# Patient Record
Sex: Female | Born: 1940 | Race: Black or African American | Hispanic: No | Marital: Single | Smoking: Never smoker
Health system: Southern US, Community
[De-identification: ages and names within clinical notes are randomized; demographics above are authoritative.]

## PROBLEM LIST (undated history)

## (undated) DIAGNOSIS — I10 Essential (primary) hypertension: Secondary | ICD-10-CM

---

## 1998-01-04 ENCOUNTER — Ambulatory Visit (HOSPITAL_COMMUNITY): Admission: RE | Admit: 1998-01-04 | Discharge: 1998-01-04 | Payer: Self-pay | Admitting: Gastroenterology

## 1998-11-14 ENCOUNTER — Ambulatory Visit (HOSPITAL_COMMUNITY): Admission: RE | Admit: 1998-11-14 | Discharge: 1998-11-14 | Payer: Self-pay | Admitting: Gastroenterology

## 2010-06-15 ENCOUNTER — Emergency Department (HOSPITAL_COMMUNITY)
Admission: EM | Admit: 2010-06-15 | Discharge: 2010-06-15 | Payer: Self-pay | Source: Home / Self Care | Admitting: Emergency Medicine

## 2010-09-17 ENCOUNTER — Emergency Department (INDEPENDENT_AMBULATORY_CARE_PROVIDER_SITE_OTHER): Payer: Medicare HMO

## 2010-09-17 ENCOUNTER — Emergency Department (HOSPITAL_BASED_OUTPATIENT_CLINIC_OR_DEPARTMENT_OTHER)
Admission: EM | Admit: 2010-09-17 | Discharge: 2010-09-17 | Disposition: A | Discharge status: Home / Self Care | Payer: Medicare HMO | Attending: Emergency Medicine | Admitting: Emergency Medicine

## 2010-09-17 DIAGNOSIS — R059 Cough, unspecified: Secondary | ICD-10-CM | POA: Insufficient documentation

## 2010-09-17 DIAGNOSIS — R509 Fever, unspecified: Secondary | ICD-10-CM

## 2010-09-17 DIAGNOSIS — J069 Acute upper respiratory infection, unspecified: Secondary | ICD-10-CM | POA: Insufficient documentation

## 2010-09-17 DIAGNOSIS — I10 Essential (primary) hypertension: Secondary | ICD-10-CM | POA: Insufficient documentation

## 2010-09-17 DIAGNOSIS — R05 Cough: Secondary | ICD-10-CM | POA: Insufficient documentation

## 2010-09-30 ENCOUNTER — Other Ambulatory Visit: Payer: Self-pay | Admitting: Orthopaedic Surgery

## 2010-09-30 DIAGNOSIS — M25512 Pain in left shoulder: Secondary | ICD-10-CM

## 2010-10-03 ENCOUNTER — Ambulatory Visit
Admission: RE | Admit: 2010-10-03 | Discharge: 2010-10-03 | Disposition: A | Discharge status: Home / Self Care | Payer: Self-pay | Source: Ambulatory Visit | Attending: Orthopaedic Surgery | Admitting: Orthopaedic Surgery

## 2010-10-03 DIAGNOSIS — M25512 Pain in left shoulder: Secondary | ICD-10-CM

## 2011-01-13 ENCOUNTER — Ambulatory Visit: Payer: Medicare HMO | Attending: Orthopaedic Surgery | Admitting: Physical Therapy

## 2011-01-13 DIAGNOSIS — M25619 Stiffness of unspecified shoulder, not elsewhere classified: Secondary | ICD-10-CM | POA: Insufficient documentation

## 2011-01-13 DIAGNOSIS — R5381 Other malaise: Secondary | ICD-10-CM | POA: Insufficient documentation

## 2011-01-13 DIAGNOSIS — IMO0001 Reserved for inherently not codable concepts without codable children: Secondary | ICD-10-CM | POA: Insufficient documentation

## 2011-01-13 DIAGNOSIS — M25519 Pain in unspecified shoulder: Secondary | ICD-10-CM | POA: Insufficient documentation

## 2011-01-15 ENCOUNTER — Ambulatory Visit: Payer: Medicare HMO | Admitting: Rehabilitation

## 2011-01-17 ENCOUNTER — Ambulatory Visit: Payer: Medicare HMO | Admitting: Physical Therapy

## 2011-01-20 ENCOUNTER — Ambulatory Visit: Payer: Medicare HMO | Admitting: Physical Therapy

## 2011-01-22 ENCOUNTER — Ambulatory Visit: Payer: Medicare HMO | Admitting: Rehabilitation

## 2011-01-24 ENCOUNTER — Ambulatory Visit: Payer: Medicare HMO | Admitting: Rehabilitation

## 2011-01-27 ENCOUNTER — Ambulatory Visit: Payer: Medicare HMO | Admitting: Rehabilitation

## 2011-01-29 ENCOUNTER — Ambulatory Visit: Payer: Medicare HMO | Admitting: Rehabilitation

## 2011-01-31 ENCOUNTER — Ambulatory Visit: Payer: Medicare HMO | Admitting: Rehabilitation

## 2011-02-03 ENCOUNTER — Ambulatory Visit: Payer: Medicare HMO | Admitting: Rehabilitation

## 2011-02-05 ENCOUNTER — Ambulatory Visit: Payer: No Typology Code available for payment source | Attending: Orthopaedic Surgery | Admitting: Rehabilitation

## 2011-02-05 DIAGNOSIS — M25619 Stiffness of unspecified shoulder, not elsewhere classified: Secondary | ICD-10-CM | POA: Insufficient documentation

## 2011-02-05 DIAGNOSIS — IMO0001 Reserved for inherently not codable concepts without codable children: Secondary | ICD-10-CM | POA: Insufficient documentation

## 2011-02-05 DIAGNOSIS — M25519 Pain in unspecified shoulder: Secondary | ICD-10-CM | POA: Insufficient documentation

## 2011-02-05 DIAGNOSIS — R5381 Other malaise: Secondary | ICD-10-CM | POA: Insufficient documentation

## 2011-02-07 ENCOUNTER — Ambulatory Visit: Payer: No Typology Code available for payment source | Admitting: Physical Therapy

## 2011-02-10 ENCOUNTER — Ambulatory Visit: Payer: No Typology Code available for payment source | Admitting: Physical Therapy

## 2011-02-12 ENCOUNTER — Ambulatory Visit: Payer: No Typology Code available for payment source | Admitting: Rehabilitation

## 2011-02-14 ENCOUNTER — Ambulatory Visit: Payer: No Typology Code available for payment source | Admitting: Rehabilitation

## 2011-02-17 ENCOUNTER — Ambulatory Visit: Payer: No Typology Code available for payment source | Admitting: Rehabilitation

## 2011-02-19 ENCOUNTER — Ambulatory Visit: Payer: No Typology Code available for payment source | Admitting: Rehabilitation

## 2011-02-24 ENCOUNTER — Encounter: Payer: No Typology Code available for payment source | Admitting: Physical Therapy

## 2011-02-26 ENCOUNTER — Ambulatory Visit: Payer: No Typology Code available for payment source | Admitting: Physical Therapy

## 2011-02-28 ENCOUNTER — Ambulatory Visit: Payer: No Typology Code available for payment source | Admitting: Rehabilitation

## 2011-03-03 ENCOUNTER — Ambulatory Visit: Payer: No Typology Code available for payment source | Admitting: Physical Therapy

## 2011-03-05 ENCOUNTER — Ambulatory Visit: Payer: No Typology Code available for payment source | Admitting: Rehabilitation

## 2011-03-11 ENCOUNTER — Encounter: Payer: No Typology Code available for payment source | Admitting: Rehabilitation

## 2011-03-12 ENCOUNTER — Encounter: Payer: No Typology Code available for payment source | Admitting: Physical Therapy

## 2012-04-24 ENCOUNTER — Other Ambulatory Visit: Payer: Self-pay | Admitting: Hematology & Oncology
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
## 2018-01-11 ENCOUNTER — Other Ambulatory Visit: Payer: Self-pay | Admitting: Hematology & Oncology

## 2018-05-05 ENCOUNTER — Other Ambulatory Visit: Payer: Self-pay | Admitting: Hematology & Oncology

## 2019-08-16 ENCOUNTER — Ambulatory Visit: Payer: Medicare HMO | Attending: Internal Medicine

## 2019-08-16 DIAGNOSIS — Z20822 Contact with and (suspected) exposure to covid-19: Secondary | ICD-10-CM

## 2019-08-17 ENCOUNTER — Ambulatory Visit: Payer: Self-pay

## 2019-08-17 LAB — NOVEL CORONAVIRUS, NAA: SARS-CoV-2, NAA: NOT DETECTED

## 2019-08-17 NOTE — Telephone Encounter (Signed)
Provided Patient with covid lab results.  Voiced understanding.

## 2021-05-13 ENCOUNTER — Emergency Department (HOSPITAL_BASED_OUTPATIENT_CLINIC_OR_DEPARTMENT_OTHER)
Admission: EM | Admit: 2021-05-13 | Discharge: 2021-05-13 | Disposition: A | Discharge status: Home / Self Care | Payer: Medicare HMO | Attending: Emergency Medicine | Admitting: Emergency Medicine

## 2021-05-13 ENCOUNTER — Encounter (HOSPITAL_BASED_OUTPATIENT_CLINIC_OR_DEPARTMENT_OTHER): Payer: Self-pay

## 2021-05-13 ENCOUNTER — Other Ambulatory Visit: Payer: Self-pay

## 2021-05-13 DIAGNOSIS — L03211 Cellulitis of face: Secondary | ICD-10-CM | POA: Insufficient documentation

## 2021-05-13 DIAGNOSIS — K047 Periapical abscess without sinus: Secondary | ICD-10-CM | POA: Insufficient documentation

## 2021-05-13 DIAGNOSIS — I10 Essential (primary) hypertension: Secondary | ICD-10-CM | POA: Insufficient documentation

## 2021-05-13 DIAGNOSIS — K0889 Other specified disorders of teeth and supporting structures: Secondary | ICD-10-CM | POA: Diagnosis present

## 2021-05-13 HISTORY — DX: Essential (primary) hypertension: I10

## 2021-05-13 MED ORDER — AMOXICILLIN-POT CLAVULANATE 875-125 MG PO TABS
1.0000 | ORAL_TABLET | Freq: Once | ORAL | Status: AC
  Administered 2021-05-13: 1 via ORAL
  Filled 2021-05-13: qty 1

## 2021-05-13 MED ORDER — AMOXICILLIN-POT CLAVULANATE 875-125 MG PO TABS: 1.0000 | ORAL_TABLET | Freq: Two times a day (BID) | ORAL | 0 refills | Status: AC

## 2021-05-13 NOTE — Discharge Instructions (Addendum)
If you develop fever, new or worsening swelling, new or worsening pain, difficulty swallowing, difficulty breathing or talking, or any other new/concerning symptoms then return to the ER for evaluation.

## 2021-05-13 NOTE — ED Triage Notes (Signed)
Pt started with right upper dental pain yesterday. States had some earlier in the month as well. Started with right sided facial swelling and soreness yesterday. Denies trouble swallowing/breathing

## 2021-05-13 NOTE — ED Provider Notes (Signed)
[facility] EMERGENCY DEPARTMENT Provider Note   CSN: [id number] Arrival date & time: 05/13/21  0850     History Chief Complaint  Patient presents with   Facial Swelling   Dental Pain    Belinda Mckenzie is a 80 y.o. female.  HPI 80 year old female presents with right facial swelling.  Woke up yesterday with her face swollen.  Slightly worse today.  And ibuprofen and some topical medicine.  For about 2 weeks she has been dealing with some maxillary dental pain.  Dental pain seems actually be improved.  She has not had a fever, trouble breathing or swallowing, trouble talking, or shortness of breath.  Past Medical History:  Diagnosis Date   Hypertension     There are no problems to display for this patient.   History reviewed. No pertinent surgical history.   OB History   No obstetric history on file.     History reviewed. No pertinent family history.  Social History   Tobacco Use   Smoking status: Never   Smokeless tobacco: Never  Substance Use Topics   Alcohol use: Yes    Comment: rarely   Drug use: Never    Home Medications Prior to Admission medications   Medication Sig Start Date End Date Taking? Authorizing Provider  amoxicillin-clavulanate (AUGMENTIN) 875-125 MG tablet Take 1 tablet by mouth 2 (two) times daily. One po bid x 7 days 05/13/21  Yes Pricilla Loveless, MD    Allergies    Patient has no known allergies.  Review of Systems   Review of Systems  Constitutional:  Negative for fever.  HENT:  Positive for dental problem and facial swelling. Negative for sore throat, trouble swallowing and voice change.   Respiratory:  Negative for shortness of breath.   All other systems reviewed and are negative.  Physical Exam Updated Vital Signs BP (!) 144/92 (BP Location: Right Arm)   Pulse 85   Temp 98.3 F (36.8 C) (Oral)   Resp 18   Ht 5\' 3"  (1.6 m)   Wt 84.8 kg   SpO2 97%   BMI 33.13 kg/m   Physical Exam Vitals and nursing note  reviewed.  Constitutional:      Appearance: She is well-developed.  HENT:     Head: Normocephalic and atraumatic.      Right Ear: External ear normal.     Left Ear: External ear normal.     Nose: Nose normal.     Mouth/Throat:      Comments: Multiple missing teeth and dental caries.  There is some tenderness along the maxillary dental line on the right side, questionable abscess No lip, tongue, oropharyngeal swelling otherwise No Ludwig angina/floor of the mouth swelling Eyes:     General:        Right eye: No discharge.        Left eye: No discharge.  Cardiovascular:     Rate and Rhythm: Normal rate and regular rhythm.     Heart sounds: Normal heart sounds.  Pulmonary:     Effort: Pulmonary effort is normal.     Breath sounds: Normal breath sounds.  Abdominal:     Palpations: Abdomen is soft.     Tenderness: There is no abdominal tenderness.  Skin:    General: Skin is warm and dry.  Neurological:     Mental Status: She is alert.  Psychiatric:        Mood and Affect: Mood is not anxious.    ED Results /  Procedures / Treatments   Labs (all labs ordered are listed, but only abnormal results are displayed) Labs Reviewed - No data to display  EKG None  Radiology No results found.  Procedures Procedures   Medications Ordered in ED Medications  amoxicillin-clavulanate (AUGMENTIN) 875-125 MG per tablet 1 tablet (has no administration in time range)    ED Course  I have reviewed the triage vital signs and the nursing notes.  Pertinent labs & imaging results that were available during my care of the patient were reviewed by me and considered in my medical decision making (see chart for details).    MDM Rules/Calculators/A&P                           Patient appears to have some facial cellulitis from a dental infection.  I cannot feel an obvious palpable abscess though it is soft and there might be some fluid collection to try and drain.  I offered this but  patient is declining.  We discussed that antibiotics alone might not be enough and that if she worsens at all she should return.  She is okay with this.  I do not think there is a deeper abscess and do not think a CT is needed.  No evidence of Ludwig angina on exam.  Referred to dentistry. Final Clinical Impression(s) / ED Diagnoses Final diagnoses:  Dental abscess  Facial cellulitis    Rx / DC Orders ED Discharge Orders          Ordered    amoxicillin-clavulanate (AUGMENTIN) 875-125 MG tablet  2 times daily        05/13/21 1000             Pricilla Loveless, MD 05/13/21 1007
